# Patient Record
Sex: Male | Born: 1965 | Race: White | Hispanic: No | Marital: Married | Smoking: Current every day smoker
Health system: Southern US, Community
[De-identification: ages and names within clinical notes are randomized; demographics above are authoritative.]

## PROBLEM LIST (undated history)

## (undated) DIAGNOSIS — M1990 Unspecified osteoarthritis, unspecified site: Secondary | ICD-10-CM

## (undated) HISTORY — PX: DENTAL SURGERY: SHX609

---

## 2000-02-23 ENCOUNTER — Encounter: Admission: RE | Admit: 2000-02-23 | Discharge: 2000-03-22 | Payer: Self-pay | Admitting: Family Medicine

## 2000-08-31 ENCOUNTER — Encounter: Payer: Self-pay | Admitting: Family Medicine

## 2000-08-31 ENCOUNTER — Ambulatory Visit (HOSPITAL_COMMUNITY): Admission: RE | Admit: 2000-08-31 | Discharge: 2000-08-31 | Payer: Self-pay | Admitting: Family Medicine

## 2002-09-11 ENCOUNTER — Encounter
Admission: RE | Admit: 2002-09-11 | Discharge: 2002-12-10 | Payer: Self-pay | Admitting: Physical Medicine & Rehabilitation

## 2002-12-17 ENCOUNTER — Encounter: Admission: RE | Admit: 2002-12-17 | Discharge: 2003-03-17 | Payer: Self-pay | Admitting: Anesthesiology

## 2003-03-18 ENCOUNTER — Encounter
Admission: RE | Admit: 2003-03-18 | Discharge: 2003-06-16 | Payer: Self-pay | Admitting: Physical Medicine & Rehabilitation

## 2003-06-14 ENCOUNTER — Encounter
Admission: RE | Admit: 2003-06-14 | Discharge: 2003-09-12 | Payer: Self-pay | Admitting: Physical Medicine & Rehabilitation

## 2003-12-12 ENCOUNTER — Encounter
Admission: RE | Admit: 2003-12-12 | Discharge: 2004-03-02 | Payer: Self-pay | Admitting: Physical Medicine & Rehabilitation

## 2004-03-02 ENCOUNTER — Encounter
Admission: RE | Admit: 2004-03-02 | Discharge: 2004-05-31 | Payer: Self-pay | Admitting: Physical Medicine & Rehabilitation

## 2004-03-19 ENCOUNTER — Ambulatory Visit: Payer: Self-pay | Admitting: Physical Medicine & Rehabilitation

## 2004-06-09 ENCOUNTER — Encounter
Admission: RE | Admit: 2004-06-09 | Discharge: 2004-09-07 | Payer: Self-pay | Admitting: Physical Medicine & Rehabilitation

## 2004-11-09 ENCOUNTER — Encounter
Admission: RE | Admit: 2004-11-09 | Discharge: 2005-02-18 | Payer: Self-pay | Admitting: Physical Medicine & Rehabilitation

## 2010-07-20 ENCOUNTER — Observation Stay (HOSPITAL_COMMUNITY)
Admission: EM | Admit: 2010-07-20 | Discharge: 2010-07-22 | DRG: 399 | Disposition: A | Discharge status: Home / Self Care | Payer: BC Managed Care – PPO | Attending: Internal Medicine | Admitting: Internal Medicine

## 2010-07-20 ENCOUNTER — Emergency Department (HOSPITAL_COMMUNITY): Payer: BC Managed Care – PPO

## 2010-07-20 DIAGNOSIS — R599 Enlarged lymph nodes, unspecified: Secondary | ICD-10-CM | POA: Insufficient documentation

## 2010-07-20 DIAGNOSIS — G8929 Other chronic pain: Secondary | ICD-10-CM | POA: Insufficient documentation

## 2010-07-20 DIAGNOSIS — F17200 Nicotine dependence, unspecified, uncomplicated: Secondary | ICD-10-CM | POA: Insufficient documentation

## 2010-07-20 DIAGNOSIS — M549 Dorsalgia, unspecified: Secondary | ICD-10-CM | POA: Insufficient documentation

## 2010-07-20 DIAGNOSIS — A281 Cat-scratch disease: Principal | ICD-10-CM | POA: Insufficient documentation

## 2010-07-20 LAB — DIFFERENTIAL
Basophils Absolute: 0.1 10*3/uL (ref 0.0–0.1)
Basophils Relative: 0 % (ref 0–1)
Eosinophils Absolute: 0.3 10*3/uL (ref 0.0–0.7)
Eosinophils Relative: 2 % (ref 0–5)
Lymphocytes Relative: 15 % (ref 12–46)
Lymphs Abs: 2.5 10*3/uL (ref 0.7–4.0)
Monocytes Absolute: 1 10*3/uL (ref 0.1–1.0)
Monocytes Relative: 6 % (ref 3–12)
Neutro Abs: 13.4 10*3/uL — ABNORMAL HIGH (ref 1.7–7.7)
Neutrophils Relative %: 78 % — ABNORMAL HIGH (ref 43–77)

## 2010-07-20 LAB — CBC
HCT: 42 % (ref 39.0–52.0)
Hemoglobin: 15 g/dL (ref 13.0–17.0)
MCH: 32.1 pg (ref 26.0–34.0)
MCHC: 35.7 g/dL (ref 30.0–36.0)
MCV: 89.7 fL (ref 78.0–100.0)
Platelets: 276 10*3/uL (ref 150–400)
RBC: 4.68 MIL/uL (ref 4.22–5.81)
RDW: 13 % (ref 11.5–15.5)
WBC: 17.3 10*3/uL — ABNORMAL HIGH (ref 4.0–10.5)

## 2010-07-20 LAB — POCT I-STAT, CHEM 8
BUN: 7 mg/dL (ref 6–23)
Calcium, Ion: 1.12 mmol/L (ref 1.12–1.32)
Chloride: 104 mEq/L (ref 96–112)
Creatinine, Ser: 0.9 mg/dL (ref 0.4–1.5)
Glucose, Bld: 145 mg/dL — ABNORMAL HIGH (ref 70–99)
HCT: 46 % (ref 39.0–52.0)
Hemoglobin: 15.6 g/dL (ref 13.0–17.0)
Potassium: 4.3 mEq/L (ref 3.5–5.1)
Sodium: 136 mEq/L (ref 135–145)
TCO2: 21 mmol/L (ref 0–100)

## 2010-07-21 ENCOUNTER — Encounter (HOSPITAL_COMMUNITY): Payer: Self-pay

## 2010-07-21 DIAGNOSIS — A281 Cat-scratch disease: Secondary | ICD-10-CM

## 2010-07-21 MED ORDER — IOHEXOL 300 MG/ML  SOLN: 100.0000 mL | Freq: Once | INTRAMUSCULAR | Status: AC | PRN

## 2010-07-22 LAB — DIFFERENTIAL
Basophils Relative: 0 % (ref 0–1)
Lymphs Abs: 2.7 10*3/uL (ref 0.7–4.0)
Monocytes Absolute: 1.1 10*3/uL — ABNORMAL HIGH (ref 0.1–1.0)
Monocytes Relative: 9 % (ref 3–12)
Neutro Abs: 7.9 10*3/uL — ABNORMAL HIGH (ref 1.7–7.7)

## 2010-07-22 LAB — COMPREHENSIVE METABOLIC PANEL
BUN: 10 mg/dL (ref 6–23)
CO2: 25 mEq/L (ref 19–32)
Calcium: 9.5 mg/dL (ref 8.4–10.5)
Chloride: 107 mEq/L (ref 96–112)
Creatinine, Ser: 0.9 mg/dL (ref 0.4–1.5)
GFR calc Af Amer: >60 mL/min (ref >60)
GFR calc non Af Amer: >60 mL/min (ref >60)
Glucose, Bld: 105 mg/dL — ABNORMAL HIGH (ref 70–99)
Total Bilirubin: 0.3 mg/dL (ref 0.3–1.2)

## 2010-07-22 LAB — CBC
Hemoglobin: 14.5 g/dL (ref 13.0–17.0)
MCH: 31.3 pg (ref 26.0–34.0)
MCHC: 34.4 g/dL (ref 30.0–36.0)
MCV: 90.7 fL (ref 78.0–100.0)
RBC: 4.64 MIL/uL (ref 4.22–5.81)

## 2010-07-29 NOTE — Discharge Summary (Signed)
  NAME:  Foley, [name]                 ACCOUNT NO.:  [id number]  MEDICAL RECORD NO.:  [id number]           PATIENT TYPE:  I  LOCATION:  1320                         FACILITY:  [facility]  PHYSICIAN:  Marinda Elk, M.D.DATE OF BIRTH:  25-Jan-1966  DATE OF ADMISSION:  07/20/2010 DATE OF DISCHARGE:  07/22/2010                              DISCHARGE SUMMARY   PRIMARY CARE DOCTOR:  None.  DISCHARGE DIAGNOSES: 1. Cat scratch disease. 2. Tobacco abuse.  DISCHARGE MEDICATIONS: 1. Azithromycin 250 mg p.o. daily. 2. Hydrocodone 10/375 mg p.o. q.6 h. p.r.n. 3. Multivitamin one tab daily.  PROCEDURES PERFORMED:  CT chest shows axillary lymphadenopathy consistent with infectious and inflammatory changes.  BRIEF ADMITTING HISTORY AND PHYSICAL:  This is a 45 year old man with past medical history of chronic pain admitted for neuropathy and swelling on this of arms.  I started 1 week prior to admission.  He apparently went to __________ associated with some fevers, came to the ED for further evaluation.  CT scan showed results above.  We are asked to admit him and further evaluate.  Please refer to July 21, 2010, of further details.  HOSPITAL COURSE: 1. Cat scratch disease.  I recommended to azithromycin.  Check an HIV.     HIV was done which was negative.  He will continue azithromycin 4     more days.  He will follow up with a PCP and he does not have one. 2. Tobacco abuse counseling.  On day of discharge temperature 98, pulse 87, respirations 17, blood pressure 132/84.  He was satting 94% on room air.  LABS ON DAY OF DISCHARGE:  Sodium 139, potassium 4.4, chloride 107, bicarb of 25, glucose 105, BUN of 10, creatinine 0.9.  LFTs within normal limits.  His white count shows 12.2 with an ANC of 7.9.  His HIV is negative.     Marinda Elk, M.D.     AF/MEDQ  D:  07/22/2010  T:  07/23/2010  Job:  [id number]  Electronically Signed by Lambert Keto M.D. on 07/29/2010  10:18:11 AM

## 2010-08-25 NOTE — H&P (Signed)
NAME:  [name] Foley, [name] Foley                 ACCOUNT NO.:  [id number]  MEDICAL RECORD NO.:  [id number]           PATIENT TYPE:  E  LOCATION:  WLED                         FACILITY:  [facility]  PHYSICIAN:  Massie Maroon, MD        DATE OF BIRTH:  01-05-1966  DATE OF ADMISSION:  07/20/2010 DATE OF DISCHARGE:                             HISTORY & PHYSICAL   CHIEF COMPLAINT:  Adenopathy.  HISTORY OF PRESENT ILLNESS:  This 45 year old male with history of chronic back pain admits to developing some adenopathy or swelling under his right arm on Wednesday night.  He apparently went to urgent care earlier today due to some fever and was sent to the ED for evaluation. The patient notes that he recently had gotten a cat several months ago and the cat does scratch him.  CT scan of the chest shows axillary adenopathy consistent cat scratch disease, the largest lymph node measuring 4.8 x 1.9 x 2.6 cm.  The patient will be admitted for workup of axillary adenopathy, in light of the fact that he does not have any primary care physician to follow up with.  PAST MEDICAL HISTORY:  Back pain.  PAST SURGICAL HISTORY: 1. ESI (multiple) by Dr. Ethelene Hal. 2. Deviated septum repair.  SOCIAL HISTORY:  The patient smokes 1 pack per day for 35 years.  The patient drinks about 1 to 3 beers per week.  FAMILY HISTORY:  Father had neck, back, and hip surgery.  ALLERGIES:  No known drug allergies.  MEDICATIONS:  Norco 10/325 one p.o. q.6h. p.r.n. pain.  REVIEW OF SYSTEMS:  Negative for all 10 organ systems except for pertinent positive stated above.  PHYSICAL EXAMINATION:  VITAL SIGNS:  Temperature 99.2, pulse 107, blood pressure 150/87, pulse ox 95% on room air. HEENT:  Anicteric, EOMI, no nystagmus.  Pupils are 1.5 mm, symmetric. Direct and consensual reflexes intact.  Mucous membranes are moist. NECK:  No JVD, no bruit, no thyromegaly, no adenopathy. HEART:  Regular rate and rhythm.  S1, S2.  No murmurs, gallops, or  rubs. LUNGS:  Clear to auscultation bilaterally. ABDOMEN:  Soft, nontender, nondistended.  Positive bowel sounds. EXTREMITIES:  No cyanosis, clubbing, or edema. SKIN:  No rashes. LYMPH NODES:  A 4.4 cm right axillary adenopathy, no erythema, no warmth.  LABORATORY DATA:  WBC 17.3, hemoglobin 15.0, platelet count 276.  BUN 7, creatinine 0.9.  CT chest shows axillary adenopathy consistent with infectious or inflammatory change given history, ? Catscratch fever.  ASSESSMENT AND PLAN: 1. Right axillary adenopathy:  Continue Vicodin 10/325 one p.o. q.6 h     p.r.n. for pain.  Check VLDL, check LDH.  Treat with Unasyn 3 g IV     q.6 h.  If lymphadenopathy is not improving, then may need biopsy. 2. Chronic back pain:  Continue Norco. 3. Tobacco dependence:  The patient is counseled on smoking cessation     x10 minutes. 4. Deep vein thrombosis prophylaxis:  SCDs.     Massie Maroon, MD     JYK/MEDQ  D:  07/21/2010  T:  07/21/2010  Job:  [id number]  Electronically Signed by Pearson Grippe MD on 08/25/2010 09:49:05 PM

## 2012-03-24 ENCOUNTER — Other Ambulatory Visit: Payer: Self-pay | Admitting: Pain Medicine

## 2012-03-24 DIAGNOSIS — M545 Low back pain: Secondary | ICD-10-CM

## 2012-03-24 DIAGNOSIS — IMO0002 Reserved for concepts with insufficient information to code with codable children: Secondary | ICD-10-CM

## 2012-03-30 ENCOUNTER — Other Ambulatory Visit: Payer: BC Managed Care – PPO

## 2012-04-15 ENCOUNTER — Ambulatory Visit
Admission: RE | Admit: 2012-04-15 | Discharge: 2012-04-15 | Disposition: A | Discharge status: Home / Self Care | Payer: BC Managed Care – PPO | Source: Ambulatory Visit | Attending: Pain Medicine | Admitting: Pain Medicine

## 2012-04-15 DIAGNOSIS — M545 Low back pain: Secondary | ICD-10-CM

## 2012-04-15 DIAGNOSIS — IMO0002 Reserved for concepts with insufficient information to code with codable children: Secondary | ICD-10-CM

## 2015-06-08 ENCOUNTER — Encounter (HOSPITAL_COMMUNITY): Payer: Self-pay | Admitting: Emergency Medicine

## 2015-06-08 ENCOUNTER — Emergency Department (HOSPITAL_COMMUNITY): Payer: BLUE CROSS/BLUE SHIELD

## 2015-06-08 ENCOUNTER — Inpatient Hospital Stay (HOSPITAL_COMMUNITY)
Admission: EM | Admit: 2015-06-08 | Discharge: 2015-06-10 | DRG: 603 | Disposition: A | Discharge status: Home / Self Care | Payer: BLUE CROSS/BLUE SHIELD | Attending: Internal Medicine | Admitting: Internal Medicine

## 2015-06-08 DIAGNOSIS — G8929 Other chronic pain: Secondary | ICD-10-CM | POA: Diagnosis present

## 2015-06-08 DIAGNOSIS — F17210 Nicotine dependence, cigarettes, uncomplicated: Secondary | ICD-10-CM | POA: Diagnosis present

## 2015-06-08 DIAGNOSIS — E669 Obesity, unspecified: Secondary | ICD-10-CM | POA: Diagnosis present

## 2015-06-08 DIAGNOSIS — M1990 Unspecified osteoarthritis, unspecified site: Secondary | ICD-10-CM | POA: Diagnosis present

## 2015-06-08 DIAGNOSIS — Z716 Tobacco abuse counseling: Secondary | ICD-10-CM | POA: Diagnosis not present

## 2015-06-08 DIAGNOSIS — Z809 Family history of malignant neoplasm, unspecified: Secondary | ICD-10-CM

## 2015-06-08 DIAGNOSIS — K029 Dental caries, unspecified: Secondary | ICD-10-CM | POA: Insufficient documentation

## 2015-06-08 DIAGNOSIS — Z79899 Other long term (current) drug therapy: Secondary | ICD-10-CM

## 2015-06-08 DIAGNOSIS — G629 Polyneuropathy, unspecified: Secondary | ICD-10-CM

## 2015-06-08 DIAGNOSIS — M545 Low back pain, unspecified: Secondary | ICD-10-CM | POA: Diagnosis present

## 2015-06-08 DIAGNOSIS — Z6839 Body mass index (BMI) 39.0-39.9, adult: Secondary | ICD-10-CM

## 2015-06-08 DIAGNOSIS — K047 Periapical abscess without sinus: Secondary | ICD-10-CM | POA: Diagnosis present

## 2015-06-08 DIAGNOSIS — L03211 Cellulitis of face: Secondary | ICD-10-CM | POA: Diagnosis not present

## 2015-06-08 DIAGNOSIS — R030 Elevated blood-pressure reading, without diagnosis of hypertension: Secondary | ICD-10-CM | POA: Diagnosis present

## 2015-06-08 HISTORY — DX: Unspecified osteoarthritis, unspecified site: M19.90

## 2015-06-08 LAB — CBC WITH DIFFERENTIAL/PLATELET
BASOS ABS: 0 10*3/uL (ref 0.0–0.1)
BASOS PCT: 0 %
Eosinophils Absolute: 0.2 10*3/uL (ref 0.0–0.7)
Eosinophils Relative: 1 %
HEMATOCRIT: 43.9 % (ref 39.0–52.0)
HEMOGLOBIN: 15.2 g/dL (ref 13.0–17.0)
LYMPHS PCT: 13 %
Lymphs Abs: 1.9 10*3/uL (ref 0.7–4.0)
MCH: 31.7 pg (ref 26.0–34.0)
MCHC: 34.6 g/dL (ref 30.0–36.0)
MCV: 91.6 fL (ref 78.0–100.0)
MONO ABS: 1.1 10*3/uL — AB (ref 0.1–1.0)
MONOS PCT: 7 %
NEUTROS ABS: 12 10*3/uL — AB (ref 1.7–7.7)
NEUTROS PCT: 79 %
Platelets: 213 10*3/uL (ref 150–400)
RBC: 4.79 MIL/uL (ref 4.22–5.81)
RDW: 13.1 % (ref 11.5–15.5)
WBC: 15.2 10*3/uL — ABNORMAL HIGH (ref 4.0–10.5)

## 2015-06-08 LAB — BASIC METABOLIC PANEL
ANION GAP: 8 (ref 5–15)
BUN: 17 mg/dL (ref 6–20)
CHLORIDE: 106 mmol/L (ref 101–111)
CO2: 23 mmol/L (ref 22–32)
Calcium: 9.3 mg/dL (ref 8.9–10.3)
Creatinine, Ser: 0.87 mg/dL (ref 0.61–1.24)
GFR calc non Af Amer: >60 mL/min (ref >60)
GLUCOSE: 115 mg/dL — AB (ref 65–99)
Potassium: 4 mmol/L (ref 3.5–5.1)
Sodium: 137 mmol/L (ref 135–145)

## 2015-06-08 MED ORDER — ADULT MULTIVITAMIN W/MINERALS CH
1.0000 | ORAL_TABLET | Freq: Every day | ORAL | Status: DC
  Administered 2015-06-08 – 2015-06-10 (×3): 1 via ORAL
  Filled 2015-06-08 (×3): qty 1

## 2015-06-08 MED ORDER — ONDANSETRON HCL 4 MG PO TABS: 4.0000 mg | ORAL_TABLET | Freq: Four times a day (QID) | ORAL | Status: DC | PRN

## 2015-06-08 MED ORDER — MORPHINE SULFATE (PF) 4 MG/ML IV SOLN
4.0000 mg | Freq: Once | INTRAVENOUS | Status: AC
  Administered 2015-06-08: 4 mg via INTRAVENOUS
  Filled 2015-06-08: qty 1

## 2015-06-08 MED ORDER — ENOXAPARIN SODIUM 40 MG/0.4ML ~~LOC~~ SOLN
40.0000 mg | SUBCUTANEOUS | Status: DC
  Administered 2015-06-08: 40 mg via SUBCUTANEOUS
  Filled 2015-06-08: qty 0.4

## 2015-06-08 MED ORDER — MORPHINE SULFATE (PF) 4 MG/ML IV SOLN
4.0000 mg | Freq: Once | INTRAVENOUS | Status: AC
Start: 2015-06-08 — End: 2015-06-08
  Administered 2015-06-08: 4 mg via INTRAVENOUS
  Filled 2015-06-08: qty 1

## 2015-06-08 MED ORDER — VITAMIN B-12 100 MCG PO TABS
100.0000 ug | ORAL_TABLET | Freq: Every day | ORAL | Status: DC
  Administered 2015-06-08 – 2015-06-10 (×3): 100 ug via ORAL
  Filled 2015-06-08 (×3): qty 1

## 2015-06-08 MED ORDER — PHENTERMINE HCL 37.5 MG PO TABS: 37.5000 mg | ORAL_TABLET | Freq: Every day | ORAL | Status: DC

## 2015-06-08 MED ORDER — ONDANSETRON HCL 4 MG/2ML IJ SOLN: 4.0000 mg | Freq: Four times a day (QID) | INTRAMUSCULAR | Status: DC | PRN

## 2015-06-08 MED ORDER — CLINDAMYCIN PHOSPHATE 600 MG/50ML IV SOLN
600.0000 mg | Freq: Three times a day (TID) | INTRAVENOUS | Status: DC
  Administered 2015-06-08 – 2015-06-10 (×5): 600 mg via INTRAVENOUS
  Filled 2015-06-08 (×6): qty 50

## 2015-06-08 MED ORDER — HYDROCODONE-ACETAMINOPHEN 10-325 MG PO TABS
1.0000 | ORAL_TABLET | ORAL | Status: DC | PRN
  Administered 2015-06-08 – 2015-06-10 (×6): 1 via ORAL
  Filled 2015-06-08 (×6): qty 1

## 2015-06-08 MED ORDER — SODIUM CHLORIDE 0.9 % IV SOLN
INTRAVENOUS | Status: DC
  Administered 2015-06-08 – 2015-06-10 (×4): via INTRAVENOUS

## 2015-06-08 MED ORDER — ACETAMINOPHEN 650 MG RE SUPP: 650.0000 mg | Freq: Four times a day (QID) | RECTAL | Status: DC | PRN

## 2015-06-08 MED ORDER — KETOROLAC TROMETHAMINE 30 MG/ML IJ SOLN: 30.0000 mg | Freq: Four times a day (QID) | INTRAMUSCULAR | Status: DC | PRN

## 2015-06-08 MED ORDER — GABAPENTIN 300 MG PO CAPS
900.0000 mg | ORAL_CAPSULE | Freq: Every day | ORAL | Status: DC
  Administered 2015-06-08 – 2015-06-09 (×2): 900 mg via ORAL
  Filled 2015-06-08 (×3): qty 3

## 2015-06-08 MED ORDER — VITAMIN C 500 MG PO TABS
500.0000 mg | ORAL_TABLET | Freq: Every day | ORAL | Status: DC
  Administered 2015-06-08 – 2015-06-10 (×3): 500 mg via ORAL
  Filled 2015-06-08 (×3): qty 1

## 2015-06-08 MED ORDER — VITAMIN B-12 100 MCG PO TABS
ORAL_TABLET | Freq: Every day | ORAL | Status: DC
  Filled 2015-06-08: qty 1

## 2015-06-08 MED ORDER — HYDROCODONE BITARTRATE ER 60 MG PO T24A: 1.0000 | EXTENDED_RELEASE_TABLET | Freq: Every day | ORAL | Status: DC

## 2015-06-08 MED ORDER — GABAPENTIN 300 MG PO CAPS
300.0000 mg | ORAL_CAPSULE | Freq: Every day | ORAL | Status: DC
  Administered 2015-06-09 – 2015-06-10 (×2): 300 mg via ORAL
  Filled 2015-06-08 (×3): qty 1

## 2015-06-08 MED ORDER — MORPHINE SULFATE (PF) 2 MG/ML IV SOLN
2.0000 mg | INTRAVENOUS | Status: DC | PRN
  Administered 2015-06-08 – 2015-06-10 (×6): 2 mg via INTRAVENOUS
  Filled 2015-06-08 (×6): qty 1

## 2015-06-08 MED ORDER — ACETAMINOPHEN 325 MG PO TABS: 650.0000 mg | ORAL_TABLET | Freq: Four times a day (QID) | ORAL | Status: DC | PRN

## 2015-06-08 MED ORDER — DICLOFENAC SODIUM 75 MG PO TBEC
75.0000 mg | DELAYED_RELEASE_TABLET | Freq: Two times a day (BID) | ORAL | Status: DC
  Administered 2015-06-08 – 2015-06-10 (×4): 75 mg via ORAL
  Filled 2015-06-08 (×5): qty 1

## 2015-06-08 MED ORDER — GABAPENTIN 300 MG PO CAPS
600.0000 mg | ORAL_CAPSULE | Freq: Every day | ORAL | Status: DC
  Administered 2015-06-08 – 2015-06-09 (×2): 600 mg via ORAL
  Filled 2015-06-08 (×3): qty 2

## 2015-06-08 MED ORDER — OMEGA-3-ACID ETHYL ESTERS 1 G PO CAPS
1.0000 g | ORAL_CAPSULE | Freq: Every day | ORAL | Status: DC
  Administered 2015-06-08 – 2015-06-10 (×3): 1 g via ORAL
  Filled 2015-06-08 (×3): qty 1

## 2015-06-08 MED ORDER — IOHEXOL 300 MG/ML  SOLN
75.0000 mL | Freq: Once | INTRAMUSCULAR | Status: AC | PRN
  Administered 2015-06-08: 75 mL via INTRAVENOUS

## 2015-06-08 MED ORDER — CLINDAMYCIN PHOSPHATE 600 MG/50ML IV SOLN
600.0000 mg | Freq: Once | INTRAVENOUS | Status: AC
  Administered 2015-06-08: 600 mg via INTRAVENOUS
  Filled 2015-06-08: qty 50

## 2015-06-08 NOTE — H&P (Addendum)
[facility] History and Physical  [name] Foley ONG:[id number] DOB: 1965-07-02 DOA: 06/08/2015  Referring physician: Iantha Fallen PCP: Charyl Dancer sun at [facility] ( mainly for pain management)  Chief Complaint: right facial swelling and jaq pain x 2 days  HPI:  50 year old obese male with chronic low back pain, peripheral neuropathy (mainly involving his fingers) and arthritis who had a toothache about 2 weeks back with swollen right lower jaw. He saw a dentist who put him on Keflex for a 10 day course which he has completed. He reports that the right jaw swelling and pain did not completely go away. 2 days back he noticed that his right jaw pain was worse. Since yesterday morning he noticed increased swelling of the right jaw with erythema on the cheek with some warmth and increased throbbing pain. Denies any fever, difficulty swallowing, hoarseness of voice, chills, nausea, vomiting, headache, dizziness, chest pain, palpitations or shortness of breath, bowel or urinary symptoms.  Course in the ED patient had low-grade fever and elevated blood pressure. Blood work showed WBC of 15.2 K, remaining labs were normal. Maxillofacial CT with contrast done showed fluid filled spaces around the roots of posterior molars in the maxilla bilaterally concerning for possible dental abscesses. Also showed extensive inflammatory changes involving the soft tissues lateral to the right side of the mandible without fluid collection or abscess consistent with cellulitis. Patient given a dose of IV clindamycin and IV morphine and hospitalist admission requested on observation.  Review of Systems:  Constitutional: Denies fever, chills, diaphoresis, appetite change and fatigue.  HEENT: Denies visual or hearing symptoms , swelling over right side of the face with redness and pain, denies difficulty swallowing, change in voice neck pain or stiffness Respiratory: Denies SOB, DOE, cough, chest tightness,  and  wheezing.   Cardiovascular: Denies chest pain, palpitations and leg swelling.  Gastrointestinal: Denies nausea, vomiting, abdominal pain, diarrhea, constipation, blood in stool and abdominal distention.  Genitourinary: Denies dysuria, urgency, frequency, hematuria, flank pain and difficulty urinating.  Endocrine: Denies: hot or cold intolerance, Musculoskeletal: Chronic back pain, joint pains, Denies myalgias, , joint swelling, and gait problem.  Skin: Denies pallor, rash and wound.  Neurological: Denies dizziness, seizures, syncope, weakness, light-headedness, numbness and headaches.  Hematological: Denies adenopathy.  Psychiatric/Behavioral: Denies  confusion,   Past Medical History  Diagnosis Date  . Arthritis    Past Surgical History  Procedure Laterality Date  . Dental surgery     Social History:  reports that he has been smoking.  He does not have any smokeless tobacco history on file. He reports that he drinks alcohol. His drug history is not on file.  No Known Allergies  Family History  Problem Relation Age of Onset  . Cancer Mother     Prior to Admission medications   Medication Sig Start Date End Date Taking? Authorizing Provider  Ascorbic Acid (VITAMIN C PO) Take 1 tablet by mouth daily.   Yes Historical Provider, MD  Cyanocobalamin (VITAMIN B 12 PO) Take 1 capsule by mouth daily.   Yes Historical Provider, MD  diclofenac (VOLTAREN) 75 MG EC tablet Take 75 mg by mouth 2 (two) times daily. 06/05/15  Yes Historical Provider, MD  gabapentin (NEURONTIN) 300 MG capsule TAKE 1 CAPSULE BY MOUTH AT LUNCH, 2 CAPSULES AT SUPPER, AND 3 CAPSULES AT BEDTIME 06/05/15  Yes Historical Provider, MD  HYDROcodone-acetaminophen (NORCO) 10-325 MG tablet Take 1 tablet by mouth 4 (four) times daily as needed for moderate pain.  06/04/15  Yes Historical Provider, MD  [facility] 60 MG T24A Take 1 tablet by mouth daily. 05/24/15  Yes Historical Provider, MD  Multiple Vitamin (MULTIVITAMIN WITH  MINERALS) TABS tablet Take 1 tablet by mouth daily.   Yes Historical Provider, MD  Omega-3 Fatty Acids (FISH OIL PO) Take 1 capsule by mouth daily.   Yes Historical Provider, MD  phentermine (ADIPEX-P) 37.5 MG tablet Take 37.5 mg by mouth daily. 05/16/15  Yes Historical Provider, MD     Physical Exam:  Filed Vitals:   06/08/15 1022 06/08/15 1240  BP: 181/106 137/71  Pulse: 93 74  Temp: 99.2 F (37.3 C)   TempSrc: Oral   Resp: 18 17  SpO2: 96% 95%    Constitutional: Vital signs reviewed.  Middle aged obese male not in distress HEENT: Erythema with significant swelling of the right lower jaw, multiple caried teeth,  Tender to pressure over first right lower premolar, no abscess noted , tender to pressure over right jaw.  no pallor, no icterus, moist oral mucosa, no cervical lymphadenopathy Cardiovascular: Normal S1 and S2, no murmurs Chest: CTAB, no wheezes, rales, or rhonchi Abdominal: Soft. Non-tender, non-distended, bowel sounds are normal,  Ext: warm, no edema Neurological: Alert and oriented   Labs on Admission:  Basic Metabolic Panel:  Recent Labs Lab 06/08/15 1110  NA 137  K 4.0  CL 106  CO2 23  GLUCOSE 115*  BUN 17  CREATININE 0.87  CALCIUM 9.3   Liver Function Tests: No results for input(s): AST, ALT, ALKPHOS, BILITOT, PROT, ALBUMIN in the last 168 hours. No results for input(s): LIPASE, AMYLASE in the last 168 hours. No results for input(s): AMMONIA in the last 168 hours. CBC:  Recent Labs Lab 06/08/15 1110  WBC 15.2*  NEUTROABS 12.0*  HGB 15.2  HCT 43.9  MCV 91.6  PLT 213   Cardiac Enzymes: No results for input(s): CKTOTAL, CKMB, CKMBINDEX, TROPONINI in the last 168 hours. BNP: Invalid input(s): POCBNP CBG: No results for input(s): GLUCAP in the last 168 hours.  Radiological Exams on Admission: Ct Maxillofacial W/cm  06/08/2015  CLINICAL DATA:  Severe right facial swelling and pain. EXAM: CT MAXILLOFACIAL WITH CONTRAST TECHNIQUE: Multidetector  CT imaging of the maxillofacial structures was performed with intravenous contrast. Multiplanar CT image reconstructions were also generated. A small metallic BB was placed on the right temple in order to reliably differentiate right from left. CONTRAST:  75mL OMNIPAQUE IOHEXOL 300 MG/ML  SOLN COMPARISON:  None. FINDINGS: No fracture is noted. Paranasal sinuses appear normal. Pterygoid plates appear normal. Poor dentition is noted. Bony lucency is seen around the roots of posterior molars bilaterally which may represent abscess. Globes and orbits are unremarkable. Abnormal soft tissue stranding is noted adjacent to the right mandible without definite abscess formation, most consistent with cellulitis. Parotid and submandibular glands are unremarkable. Mildly enlarged lymph nodes are noted in both level 2 regions, with the largest measuring 8 x 12 mm on the right ; these most likely are inflammatory in etiology. IMPRESSION: Poor dentition is noted, with fluid-filled spaces around the roots of posterior molars in the maxilla bilaterally concerning for possible dental abscesses. Extensive inflammatory changes are seen involving the soft tissues lateral to the right side of the mandible without fluid collection or abscess. This is most consistent with cellulitis. Electronically Signed   By: Lupita Raider, M.D.   On: 06/08/2015 12:26    EKG: none  Assessment/Plan  Active Problems:   Cellulitis of face with right lower  dental abscess Admit under observation. Continue empiric IV clindamycin. Pain control with home dose hydrocodone and when necessary IV morphine. Will place him on full liquid for now. Supportive care with Tylenol and antiemetics as needed. -Will consult dentist in a.m. If available. Otherwise will monitor for improvement on antibiotics and discharge him to follow up with dentist to have infected teeth extraction on 1/3.   Chronic low back pain and peripheral neuropathy Resume home  medications  Obesity  Tobacco abuse Smokes one pack per day Counseled on cessation    Diet:full liquid  DVT prophylaxis: sq lovenox   Code Status: full code Family Communication: wife at bedside Disposition Plan: Admit under observation. Home once clinically improved.  Eddie NorthDHUNGEL, Richele Strand [facility] Pager [phone]  Total time spent on admission :50 minutes  If 7PM-7AM, please contact night-coverage [email or web address] Password [email or web address] 06/08/2015, 1:48 PM

## 2015-06-08 NOTE — ED Notes (Signed)
Pt has profound swelling on r/side of face. Pt stated that the pain and swelling have increased over the last 2 days. He was seen by dentist 2 weeks ago for c/o dental pain, treated for infection. Reported some relief of discomfort until 2 days ago. Pt is alert and appropriate. Wife at bedside

## 2015-06-08 NOTE — ED Provider Notes (Signed)
HPI: Patient reports 2 week history of constant, moderate, worsening right lower dental pain. He reports he was seen by his dentist, and given an abx which he took to completion.  However, 2 days ago his pain and swelling became worse.  He is takes norco, and that has not alleviated his pain.  He denies difficulty swallowing, trismus, N/V, fever, chills, SOB, CP, or abdominal pain.  PE: General: Alert and no acute distress HEENT: atraumatic.  Significant right sided facial and neck swelling.  No trismus.  Right anterior neck TTP.  No erythema or crepitus. Respiratory: normal effort, no retractions, no use of accessory muscles Cardiac: regular rate and rhythm Abdominal: nondistended, soft Neuro: no gross deficit Psych: normal speech, appropriate mood and affect   MDM: Patient presents with worsening dental pain after failing PO abx.  I believe patient needs IV abx and probable CT maxillofacial to assess extent of infection.  Patient will be moved to the acute side.         Cheri FowlerKayla Olamide Lahaie, PA-C 06/08/15 1115

## 2015-06-08 NOTE — ED Provider Notes (Signed)
CSN: [id number]     Arrival date & time 06/08/15  1005 History   First MD Initiated Contact with Patient 06/08/15 1009     Chief Complaint  Patient presents with  . Facial Swelling    2 day hx of facial swelling  . Dental Pain    2 week hx of dental pain   HPI Patient started having trouble with toothache and mild facial swelling about 2 weeks ago. He was evaluated by a dentist and was started on antibiotics. He was told the symptoms should be getting better within a day or 2. Patient has noticed that his symptoms really have not changed that much. He was unable to follow up with his dentist because the office has been closed since Christmas. Patient states yesterday and today however the symptoms became acutely worse. His face is significantly more swollen. He is having increasing pain. He denies any trouble with fever. He denies any difficulty swallowing. Past Medical History  Diagnosis Date  . Arthritis    Past Surgical History  Procedure Laterality Date  . Dental surgery     Family History  Problem Relation Age of Onset  . Cancer Mother    Social History  Substance Use Topics  . Smoking status: Current Every Day Smoker  . Smokeless tobacco: None  . Alcohol Use: Yes    Review of Systems  All other systems reviewed and are negative.     Allergies  Review of patient's allergies indicates no known allergies.  Home Medications   Prior to Admission medications   Medication Sig Start Date End Date Taking? Authorizing Provider  Ascorbic Acid (VITAMIN C PO) Take 1 tablet by mouth daily.   Yes Historical Provider, MD  Cyanocobalamin (VITAMIN B 12 PO) Take 1 capsule by mouth daily.   Yes Historical Provider, MD  diclofenac (VOLTAREN) 75 MG EC tablet Take 75 mg by mouth 2 (two) times daily. 06/05/15  Yes Historical Provider, MD  gabapentin (NEURONTIN) 300 MG capsule TAKE 1 CAPSULE BY MOUTH AT LUNCH, 2 CAPSULES AT SUPPER, AND 3 CAPSULES AT BEDTIME 06/05/15  Yes Historical  Provider, MD  HYDROcodone-acetaminophen (NORCO) 10-325 MG tablet Take 1 tablet by mouth 4 (four) times daily as needed for moderate pain.  06/04/15  Yes Historical Provider, MD  HYSINGLA ER 60 MG T24A Take 1 tablet by mouth daily. 05/24/15  Yes Historical Provider, MD  Multiple Vitamin (MULTIVITAMIN WITH MINERALS) TABS tablet Take 1 tablet by mouth daily.   Yes Historical Provider, MD  Omega-3 Fatty Acids (FISH OIL PO) Take 1 capsule by mouth daily.   Yes Historical Provider, MD  phentermine (ADIPEX-P) 37.5 MG tablet Take 37.5 mg by mouth daily. 05/16/15  Yes Historical Provider, MD   BP 181/106 mmHg  Pulse 93  Temp(Src) 99.2 F (37.3 C) (Oral)  Resp 18  SpO2 96% Physical Exam  Constitutional: No distress.  Overweight  HENT:  Head: Normocephalic and atraumatic.  Right Ear: External ear normal.  Left Ear: External ear normal.  Mouth/Throat: No trismus in the jaw. Dental abscesses and dental caries present. No uvula swelling or lacerations. No oropharyngeal exudate or posterior oropharyngeal edema.  Eyes: Conjunctivae are normal. Right eye exhibits no discharge. Left eye exhibits no discharge. No scleral icterus.  Neck: Normal range of motion. Neck supple. No rigidity. Erythema present. No tracheal deviation present.  Large amount of submandibular and lower facial swelling on the right side  Cardiovascular: Normal rate, regular rhythm and intact distal pulses.   Pulmonary/Chest:  Effort normal and breath sounds normal. No stridor. No respiratory distress. He has no wheezes. He has no rales.  Abdominal: Soft. Bowel sounds are normal. He exhibits no distension. There is no tenderness. There is no rebound and no guarding.  Musculoskeletal: He exhibits no edema or tenderness.  Neurological: He is alert. He has normal strength. No cranial nerve deficit (no facial droop, extraocular movements intact, no slurred speech) or sensory deficit. He exhibits normal muscle tone. He displays no seizure  activity. Coordination normal.  Skin: Skin is warm and dry. No rash noted. He is not diaphoretic.  Psychiatric: He has a normal mood and affect.  Nursing note and vitals reviewed.   ED Course  Procedures (including critical care time) Labs Review Labs Reviewed  CBC WITH DIFFERENTIAL/PLATELET - Abnormal; Notable for the following:    WBC 15.2 (*)    Neutro Abs 12.0 (*)    Monocytes Absolute 1.1 (*)    All other components within normal limits  BASIC METABOLIC PANEL - Abnormal; Notable for the following:    Glucose, Bld 115 (*)    All other components within normal limits    Imaging Review Ct Maxillofacial W/cm  06/08/2015  CLINICAL DATA:  Severe right facial swelling and pain. EXAM: CT MAXILLOFACIAL WITH CONTRAST TECHNIQUE: Multidetector CT imaging of the maxillofacial structures was performed with intravenous contrast. Multiplanar CT image reconstructions were also generated. A small metallic BB was placed on the right temple in order to reliably differentiate right from left. CONTRAST:  75mL OMNIPAQUE IOHEXOL 300 MG/ML  SOLN COMPARISON:  None. FINDINGS: No fracture is noted. Paranasal sinuses appear normal. Pterygoid plates appear normal. Poor dentition is noted. Bony lucency is seen around the roots of posterior molars bilaterally which may represent abscess. Globes and orbits are unremarkable. Abnormal soft tissue stranding is noted adjacent to the right mandible without definite abscess formation, most consistent with cellulitis. Parotid and submandibular glands are unremarkable. Mildly enlarged lymph nodes are noted in both level 2 regions, with the largest measuring 8 x 12 mm on the right ; these most likely are inflammatory in etiology. IMPRESSION: Poor dentition is noted, with fluid-filled spaces around the roots of posterior molars in the maxilla bilaterally concerning for possible dental abscesses. Extensive inflammatory changes are seen involving the soft tissues lateral to the right  side of the mandible without fluid collection or abscess. This is most consistent with cellulitis. Electronically Signed   By: Lupita Raider, M.D.   On: 06/08/2015 12:26   I have personally reviewed and evaluated these images and lab results as part of my medical decision-making.  Medications  0.9 %  sodium chloride infusion ( Intravenous New Bag/Given 06/08/15 1109)  clindamycin (CLEOCIN) IVPB 600 mg (0 mg Intravenous Stopped 06/08/15 1140)  morphine 4 MG/ML injection 4 mg (4 mg Intravenous Given 06/08/15 1110)  iohexol (OMNIPAQUE) 300 MG/ML solution 75 mL (75 mLs Intravenous Contrast Given 06/08/15 1207)  morphine 4 MG/ML injection 4 mg (4 mg Intravenous Given 06/08/15 1239)     MDM   Final diagnoses:  Facial cellulitis  Dental caries   The patient's CT scan shows dental caries and most likely abscess.  However, he is developed a facial cellulitis associated with significant inflammatory changes. There is no evidence of large abscess or fluid collection associated with this facial swelling. Patient has already been on oral antibiotics. I will consult with medical service regarding admission for IV antibiotic treatment. Patient will ultimately need further dental care.  Linwood Dibbles, MD 06/08/15 [phone]

## 2015-06-09 DIAGNOSIS — K029 Dental caries, unspecified: Secondary | ICD-10-CM | POA: Diagnosis present

## 2015-06-09 DIAGNOSIS — Z79899 Other long term (current) drug therapy: Secondary | ICD-10-CM | POA: Diagnosis not present

## 2015-06-09 DIAGNOSIS — Z716 Tobacco abuse counseling: Secondary | ICD-10-CM | POA: Diagnosis not present

## 2015-06-09 DIAGNOSIS — M545 Low back pain: Secondary | ICD-10-CM | POA: Diagnosis present

## 2015-06-09 DIAGNOSIS — G629 Polyneuropathy, unspecified: Secondary | ICD-10-CM | POA: Diagnosis present

## 2015-06-09 DIAGNOSIS — R030 Elevated blood-pressure reading, without diagnosis of hypertension: Secondary | ICD-10-CM | POA: Diagnosis present

## 2015-06-09 DIAGNOSIS — L03211 Cellulitis of face: Secondary | ICD-10-CM | POA: Diagnosis present

## 2015-06-09 DIAGNOSIS — Z6839 Body mass index (BMI) 39.0-39.9, adult: Secondary | ICD-10-CM | POA: Diagnosis not present

## 2015-06-09 DIAGNOSIS — K047 Periapical abscess without sinus: Secondary | ICD-10-CM | POA: Diagnosis present

## 2015-06-09 DIAGNOSIS — M1990 Unspecified osteoarthritis, unspecified site: Secondary | ICD-10-CM | POA: Diagnosis present

## 2015-06-09 DIAGNOSIS — G8929 Other chronic pain: Secondary | ICD-10-CM | POA: Diagnosis present

## 2015-06-09 DIAGNOSIS — E669 Obesity, unspecified: Secondary | ICD-10-CM | POA: Diagnosis present

## 2015-06-09 DIAGNOSIS — F17210 Nicotine dependence, cigarettes, uncomplicated: Secondary | ICD-10-CM | POA: Diagnosis present

## 2015-06-09 DIAGNOSIS — Z809 Family history of malignant neoplasm, unspecified: Secondary | ICD-10-CM | POA: Diagnosis not present

## 2015-06-09 LAB — CBC
HCT: 42.3 % (ref 39.0–52.0)
HEMOGLOBIN: 14.3 g/dL (ref 13.0–17.0)
MCH: 31 pg (ref 26.0–34.0)
MCHC: 33.8 g/dL (ref 30.0–36.0)
MCV: 91.8 fL (ref 78.0–100.0)
Platelets: 201 10*3/uL (ref 150–400)
RBC: 4.61 MIL/uL (ref 4.22–5.81)
RDW: 13.3 % (ref 11.5–15.5)
WBC: 14 10*3/uL — ABNORMAL HIGH (ref 4.0–10.5)

## 2015-06-09 NOTE — Progress Notes (Signed)
[facility] PROGRESS NOTE  [name] Foley ION:[id number] DOB: 03/24/1966 DOA: 06/08/2015 PCP: No primary care provider on file.  Assessment/Plan: Active Problems:  Cellulitis of face with right lower dental abscess - Continue empiric IV clindamycin. Pain control with home dose hydrocodone and when necessary IV morphine. Advanced diet. -If continues to improve will decide home in a.m. follow-up with his dentist tomorrow.     Chronic low back pain and peripheral neuropathy Resume home medications    Tobacco abuse Smokes one pack per day Counseled on cessation  Code Status: Full code Family Communication: None at bedside Disposition Plan: Home tomorrow with outpatient dental follow-up   Consultants:  None  Procedures:  Maxillofacial CT  Antibiotics:  IV Clindamycin  HPI/Subjective: Reports still having pain in his right jaw but better since yesterday  Objective: Filed Vitals:   06/08/15 2120 06/09/15 0515  BP: 149/77 140/77  Pulse: 74 83  Temp: 98.8 F (37.1 C) 98.9 F (37.2 C)  Resp: 16 16    Intake/Output Summary (Last 24 hours) at 06/09/15 1340 Last data filed at 06/09/15 0900  Gross per 24 hour  Intake   1860 ml  Output      2 ml  Net   1858 ml   Filed Weights   06/08/15 1633  Weight: 127.007 kg (280 lb)    Exam:   General: Middle-aged obese male not in distress  HEENT: Swollen right jaw with erythema (improved from admission), moist mucosa, able to open his mouth fully  Cardiovascular: Sinus and S2, no murmurs  Respiratory: Clear bilaterally  Abdomen: Soft, nondistended, nontender  Data Reviewed: Basic Metabolic Panel:  Recent Labs Lab 06/08/15 1110  NA 137  K 4.0  CL 106  CO2 23  GLUCOSE 115*  BUN 17  CREATININE 0.87  CALCIUM 9.3   Liver Function Tests: No results for input(s): AST, ALT, ALKPHOS, BILITOT, PROT, ALBUMIN in the last 168 hours. No results for input(s): LIPASE, AMYLASE in the last 168 hours. No results  for input(s): AMMONIA in the last 168 hours. CBC:  Recent Labs Lab 06/08/15 1110 06/09/15 0614  WBC 15.2* 14.0*  NEUTROABS 12.0*  --   HGB 15.2 14.3  HCT 43.9 42.3  MCV 91.6 91.8  PLT 213 201   Cardiac Enzymes: No results for input(s): CKTOTAL, CKMB, CKMBINDEX, TROPONINI in the last 168 hours. BNP (last 3 results) No results for input(s): BNP in the last 8760 hours.  ProBNP (last 3 results) No results for input(s): PROBNP in the last 8760 hours.  CBG: No results for input(s): GLUCAP in the last 168 hours.  No results found for this or any previous visit (from the past 240 hour(s)).   Studies: Ct Maxillofacial W/cm  06/08/2015  CLINICAL DATA:  Severe right facial swelling and pain. EXAM: CT MAXILLOFACIAL WITH CONTRAST TECHNIQUE: Multidetector CT imaging of the maxillofacial structures was performed with intravenous contrast. Multiplanar CT image reconstructions were also generated. A small metallic BB was placed on the right temple in order to reliably differentiate right from left. CONTRAST:  75mL OMNIPAQUE IOHEXOL 300 MG/ML  SOLN COMPARISON:  None. FINDINGS: No fracture is noted. Paranasal sinuses appear normal. Pterygoid plates appear normal. Poor dentition is noted. Bony lucency is seen around the roots of posterior molars bilaterally which may represent abscess. Globes and orbits are unremarkable. Abnormal soft tissue stranding is noted adjacent to the right mandible without definite abscess formation, most consistent with cellulitis. Parotid and submandibular glands are unremarkable. Mildly enlarged lymph nodes are  noted in both level 2 regions, with the largest measuring 8 x 12 mm on the right ; these most likely are inflammatory in etiology. IMPRESSION: Poor dentition is noted, with fluid-filled spaces around the roots of posterior molars in the maxilla bilaterally concerning for possible dental abscesses. Extensive inflammatory changes are seen involving the soft tissues lateral  to the right side of the mandible without fluid collection or abscess. This is most consistent with cellulitis. Electronically Signed   By: Lupita RaiderJames  Green Foley, M.D.   On: 06/08/2015 12:26    Scheduled Meds: . clindamycin (CLEOCIN) IV  600 mg Intravenous 3 times per day  . diclofenac  75 mg Oral BID  . gabapentin  300 mg Oral QPC breakfast  . gabapentin  600 mg Oral QPC supper  . gabapentin  900 mg Oral QHS  . multivitamin with minerals  1 tablet Oral Daily  . omega-3 acid ethyl esters  1 g Oral Daily  . vitamin B-12  100 mcg Oral Daily  . vitamin C  500 mg Oral Daily   Continuous Infusions: . sodium chloride 125 mL/hr at 06/08/15 2109      Time spent: 25 minutes    [name] Foley  [facility] Pager [phone]. If 7PM-7AM, please contact night-coverage at [email or web address], password [email or web address] 06/09/2015, 1:40 PM

## 2015-06-10 DIAGNOSIS — Z716 Tobacco abuse counseling: Secondary | ICD-10-CM | POA: Insufficient documentation

## 2015-06-10 MED ORDER — CLINDAMYCIN HCL 300 MG PO CAPS: 600.0000 mg | ORAL_CAPSULE | Freq: Three times a day (TID) | ORAL | Status: AC

## 2015-06-10 NOTE — Discharge Summary (Signed)
Physician Discharge Summary  [name] Foley JJO:[id number] DOB: [birth year] DOA: 06/08/2015  PCP: No primary care provider on file.  Admit date: 06/08/2015 Discharge date: 06/10/2015  Time spent: 25 minutes  Recommendations for Outpatient Follow-up:  1. Discharge home with follow-up with his dentist today. Patient will complete 2 weeks of oral clindamycin on/14/2017   Discharge Diagnoses:  Principal Problem:   Cellulitis of face   Active Problems:   Obesity   Peripheral neuropathy (HCC)   Low back pain   Dental abscess   Discharge Condition: Fair  Diet recommendation: Regular  Filed Weights   06/08/15 1633  Weight: 127.007 kg (280 lb)    History of present illness:  toothache about 2 weeks back with swollen right lower jaw. He saw a dentist who put him on Keflex for a 10 day course which he has completed. He reports that the right jaw swelling and pain did not completely go away. 2 days back he noticed that his right jaw pain was worse. Since yesterday morning he noticed increased swelling of the right jaw with erythema on the cheek with some warmth and increased throbbing pain. Denies any fever, difficulty swallowing, hoarseness of voice, chills, nausea, vomiting, headache, dizziness, chest pain, palpitations or shortness of breath, bowel or urinary symptoms.  Course in the ED  patient had low-grade fever and elevated blood pressure. Blood work showed WBC of 15.2 K, remaining labs were normal. Maxillofacial CT with contrast done showed fluid filled spaces around the roots of posterior molars in the maxilla bilaterally concerning for possible dental abscesses. Also showed extensive inflammatory changes involving the soft tissues lateral to the right side of the mandible without fluid collection or abscess consistent with cellulitis. Patient given a dose of IV clindamycin and IV morphine and hospitalist admission requested on observation.   Hospital Course:  Active Problems:   Cellulitis of face with right lower dental abscess - Symptoms improving with empiric IV clindamycin. Still has swelling over his lower jaw. Remains afebrile and leukocytosis improved. Pain control with home dose hydrocodone and when necessary IV morphine. Tolerated Advanced diet. He should follow-up with his dentist today or tomorrow. Stable for discharge home on oral clindamycin to complete 2 weeks course of antibiotics.    Chronic low back pain and peripheral neuropathy Resumed home medications    Tobacco abuse Smokes one pack per day Counseled on cessation  Code Status: Full code Family Communication: None at bedside Disposition Plan: Home    Consultants:  None  Procedures:  Maxillofacial CT  Antibiotics:  IV Clindamycin    Discharge Exam: Filed Vitals:   06/09/15 2145 06/10/15 0511  BP: 141/79 135/75  Pulse: 77 67  Temp: 99.1 F (37.3 C) 97.9 F (36.6 C)  Resp: 16 16    General: Morbidly obese male not in distress  HEENT: Swollen right jaw with erythema (improved from admission), moist mucosa, able to open his mouth fully, tender to pressure over lower right premolar with multiple caried teeth, no visible abscess  Cardiovascular: Normal S1 and S2, no murmurs  Respiratory: Clear bilaterally  Abdomen: Soft, nondistended, nontender  Musculoskeletal: Warm, no edema  Discharge Instructions    Current Discharge Medication List    START taking these medications   Details  clindamycin (CLEOCIN) 300 MG capsule Take 2 capsules (600 mg total) by mouth 3 (three) times daily. Qty: 72 capsule, Refills: 0      CONTINUE these medications which have NOT CHANGED   Details  Ascorbic Acid (VITAMIN C PO)  Take 1 tablet by mouth daily.    Cyanocobalamin (VITAMIN B 12 PO) Take 1 capsule by mouth daily.    diclofenac (VOLTAREN) 75 MG EC tablet Take 75 mg by mouth 2 (two) times daily. Refills: 5    gabapentin (NEURONTIN) 300 MG capsule TAKE 1 CAPSULE BY MOUTH  AT LUNCH, 2 CAPSULES AT SUPPER, AND 3 CAPSULES AT BEDTIME Refills: 5    HYDROcodone-acetaminophen (NORCO) 10-325 MG tablet Take 1 tablet by mouth 4 (four) times daily as needed for moderate pain.  Refills: 0    HYSINGLA ER 60 MG T24A Take 1 tablet by mouth daily. Refills: 0    Multiple Vitamin (MULTIVITAMIN WITH MINERALS) TABS tablet Take 1 tablet by mouth daily.    Omega-3 Fatty Acids (FISH OIL PO) Take 1 capsule by mouth daily.    phentermine (ADIPEX-P) 37.5 MG tablet Take 37.5 mg by mouth daily. Refills: 0       No Known Allergies Follow-up Information    Follow up with Lillia Abed, PA. Schedule an appointment as soon as possible for a visit in 1 day.   Specialty:  Physician Assistant   Contact information:   [address] [place] [address] [phone]        The results of significant diagnostics from this hospitalization (including imaging, microbiology, ancillary and laboratory) are listed below for reference.    Significant Diagnostic Studies: Ct Maxillofacial W/cm  06/08/2015  CLINICAL DATA:  Severe right facial swelling and pain. EXAM: CT MAXILLOFACIAL WITH CONTRAST TECHNIQUE: Multidetector CT imaging of the maxillofacial structures was performed with intravenous contrast. Multiplanar CT image reconstructions were also generated. A small metallic BB was placed on the right temple in order to reliably differentiate right from left. CONTRAST:  75mL OMNIPAQUE IOHEXOL 300 MG/ML  SOLN COMPARISON:  None. FINDINGS: No fracture is noted. Paranasal sinuses appear normal. Pterygoid plates appear normal. Poor dentition is noted. Bony lucency is seen around the roots of posterior molars bilaterally which may represent abscess. Globes and orbits are unremarkable. Abnormal soft tissue stranding is noted adjacent to the right mandible without definite abscess formation, most consistent with cellulitis. Parotid and submandibular glands are unremarkable. Mildly enlarged lymph  nodes are noted in both level 2 regions, with the largest measuring 8 x 12 mm on the right ; these most likely are inflammatory in etiology. IMPRESSION: Poor dentition is noted, with fluid-filled spaces around the roots of posterior molars in the maxilla bilaterally concerning for possible dental abscesses. Extensive inflammatory changes are seen involving the soft tissues lateral to the right side of the mandible without fluid collection or abscess. This is most consistent with cellulitis. Electronically Signed   By: Lupita Raider, M.D.   On: 06/08/2015 12:26    Microbiology: No results found for this or any previous visit (from the past 240 hour(s)).   Labs: Basic Metabolic Panel:  Recent Labs Lab 06/08/15 1110  NA 137  K 4.0  CL 106  CO2 23  GLUCOSE 115*  BUN 17  CREATININE 0.87  CALCIUM 9.3   Liver Function Tests: No results for input(s): AST, ALT, ALKPHOS, BILITOT, PROT, ALBUMIN in the last 168 hours. No results for input(s): LIPASE, AMYLASE in the last 168 hours. No results for input(s): AMMONIA in the last 168 hours. CBC:  Recent Labs Lab 06/08/15 1110 06/09/15 0614  WBC 15.2* 14.0*  NEUTROABS 12.0*  --   HGB 15.2 14.3  HCT 43.9 42.3  MCV 91.6 91.8  PLT 213 201  Cardiac Enzymes: No results for input(s): CKTOTAL, CKMB, CKMBINDEX, TROPONINI in the last 168 hours. BNP: BNP (last 3 results) No results for input(s): BNP in the last 8760 hours.  ProBNP (last 3 results) No results for input(s): PROBNP in the last 8760 hours.  CBG: No results for input(s): GLUCAP in the last 168 hours.     Signed:  Eddie North MD  [facility] 06/10/2015, 10:11 AM

## 2015-06-10 NOTE — Discharge Instructions (Signed)
Cellulitis °Cellulitis is an infection of the skin and the tissue beneath it. The infected area is usually red and tender. Cellulitis occurs most often in the arms and lower legs.  °CAUSES  °Cellulitis is caused by bacteria that enter the skin through cracks or cuts in the skin. The most common types of bacteria that cause cellulitis are staphylococci and streptococci. °SIGNS AND SYMPTOMS  °· Redness and warmth. °· Swelling. °· Tenderness or pain. °· Fever. °DIAGNOSIS  °Your health care provider can usually determine what is wrong based on a physical exam. Blood tests may also be done. °TREATMENT  °Treatment usually involves taking an antibiotic medicine. °HOME CARE INSTRUCTIONS  °· Take your antibiotic medicine as directed by your health care provider. Finish the antibiotic even if you start to feel better. °· Keep the infected arm or leg elevated to reduce swelling. °· Apply a warm cloth to the affected area up to 4 times per day to relieve pain. °· Take medicines only as directed by your health care provider. °· Keep all follow-up visits as directed by your health care provider. °SEEK MEDICAL CARE IF:  °· You notice red streaks coming from the infected area. °· Your red area gets larger or turns dark in color. °· Your bone or joint underneath the infected area becomes painful after the skin has healed. °· Your infection returns in the same area or another area. °· You notice a swollen bump in the infected area. °· You develop new symptoms. °· You have a fever. °SEEK IMMEDIATE MEDICAL CARE IF:  °· You feel very sleepy. °· You develop vomiting or diarrhea. °· You have a general ill feeling (malaise) with muscle aches and pains. °  °This information is not intended to replace advice given to you by your health care provider. Make sure you discuss any questions you have with your health care provider. °  °Document Released: 03/03/2005 Document Revised: 02/12/2015 Document Reviewed: 08/09/2011 °Elsevier Interactive  Patient Education ©2016 [employer] °Dental Caries °Dental caries (also called tooth decay) is the most common oral disease. It can occur at any age but is more common in children and young adults.  °HOW DENTAL CARIES DEVELOPS  °The process of decay begins when bacteria and foods (particularly sugars and starches) combine in your mouth to produce plaque. Plaque is a substance that sticks to the hard, outer surface of a tooth (enamel). The bacteria in plaque produce acids that attack enamel. These acids may also attack the root surface of a tooth (cementum) if it is exposed. Repeated attacks dissolve these surfaces and create holes in the tooth (cavities). If left untreated, the acids destroy the other layers of the tooth.  °RISK FACTORS °· Frequent sipping of sugary beverages.   °· Frequent snacking on sugary and starchy foods, especially those that easily get stuck in the teeth.   °· Poor oral hygiene.   °· Dry mouth.   °· Substance abuse such as methamphetamine abuse.   °· Broken or poor-fitting dental restorations.   °· Eating disorders.   °· Gastroesophageal reflux disease (GERD).   °· Certain radiation treatments to the head and neck. °SYMPTOMS °In the early stages of dental caries, symptoms are seldom present. Sometimes white, chalky areas may be seen on the enamel or other tooth layers. In later stages, symptoms may include: °· Pits and holes on the enamel. °· Toothache after sweet, hot, or cold foods or drinks are consumed. °· Pain around the tooth. °· Swelling around the tooth. °DIAGNOSIS  °Most of the time, dental   caries is detected during a regular dental checkup. A diagnosis is made after a thorough medical and dental history is taken and the surfaces of your teeth are checked for signs of dental caries. Sometimes special instruments, such as lasers, are used to check for dental caries. Dental X-ray exams may be taken so that areas not visible to the eye (such as between the contact areas of the teeth)  can be checked for cavities.  °TREATMENT  °If dental caries is in its early stages, it may be reversed with a fluoride treatment or an application of a remineralizing agent at the dental office. Thorough brushing and flossing at home is needed to aid these treatments. If it is in its later stages, treatment depends on the location and extent of tooth destruction:  °· If a small area of the tooth has been destroyed, the destroyed area will be removed and cavities will be filled with a material such as gold, silver amalgam, or composite resin.   °· If a large area of the tooth has been destroyed, the destroyed area will be removed and a cap (crown) will be fitted over the remaining tooth structure.   °· If the center part of the tooth (pulp) is affected, a procedure called a root canal will be needed before a filling or crown can be placed.   °· If most of the tooth has been destroyed, the tooth may need to be pulled (extracted). °HOME CARE INSTRUCTIONS °You can prevent, stop, or reverse dental caries at home by practicing good oral hygiene. Good oral hygiene includes: °· Thoroughly cleaning your teeth at least twice a day with a toothbrush and dental floss.   °· Using a fluoride toothpaste. A fluoride mouth rinse may also be used if recommended by your dentist or health care provider.   °· Restricting the amount of sugary and starchy foods and sugary liquids you consume.   °· Avoiding frequent snacking on these foods and sipping of these liquids.   °· Keeping regular visits with a dentist for checkups and cleanings. °PREVENTION  °· Practice good oral hygiene. °· Consider a dental sealant. A dental sealant is a coating material that is applied by your dentist to the pits and grooves of teeth. The sealant prevents food from being trapped in them. It may protect the teeth for several years. °· Ask about fluoride supplements if you live in a community without fluorinated water or with water that has a low fluoride  content. Use fluoride supplements as directed by your dentist or health care provider. °· Allow fluoride varnish applications to teeth if directed by your dentist or health care provider. °  °This information is not intended to replace advice given to you by your health care provider. Make sure you discuss any questions you have with your health care provider. °  °Document Released: 02/13/2002 Document Revised: 06/14/2014 Document Reviewed: 05/26/2012 °Elsevier Interactive Patient Education ©2016 [employer] ° °

## 2016-09-30 ENCOUNTER — Other Ambulatory Visit: Payer: Self-pay | Admitting: Pain Medicine

## 2016-09-30 DIAGNOSIS — M545 Low back pain: Secondary | ICD-10-CM

## 2016-11-05 IMAGING — CT CT MAXILLOFACIAL W/ CM
3 series · 14 of 47 positions shown, 16 images · IV contrast (omnipaque)
Comparison: None.

CLINICAL DATA: Severe right facial swelling and pain.

EXAM:
CT MAXILLOFACIAL WITH CONTRAST
TECHNIQUE: Multidetector CT imaging of the maxillofacial structures was
performed with intravenous contrast. Multiplanar CT image
reconstructions were also generated. A small metallic BB was placed
on the right temple in order to reliably differentiate right from
left.
CONTRAST:  75mL OMNIPAQUE IOHEXOL 300 MG/ML  SOLN

[Series 3: facial st · axial · 0.38mm/px · z∈[-224,-66]mm · 8 of 93 slices shown, 10 images]
[im 7/93  brain]
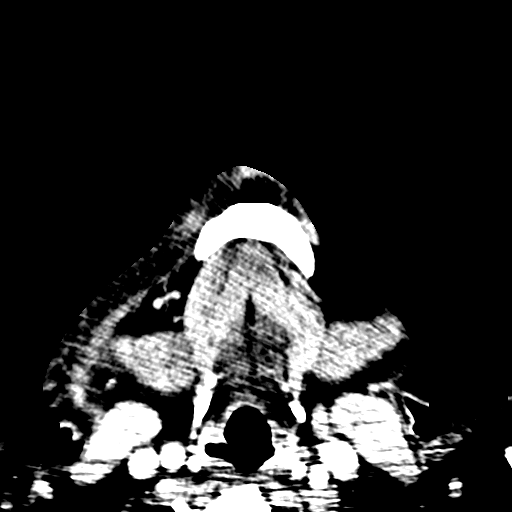
[im 7/93  bone]
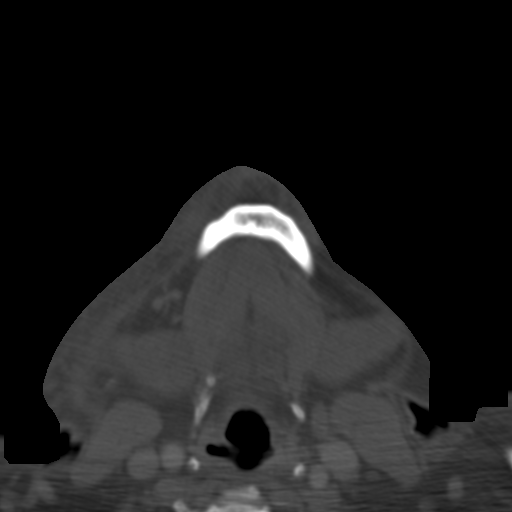
[im 20/93  bone]
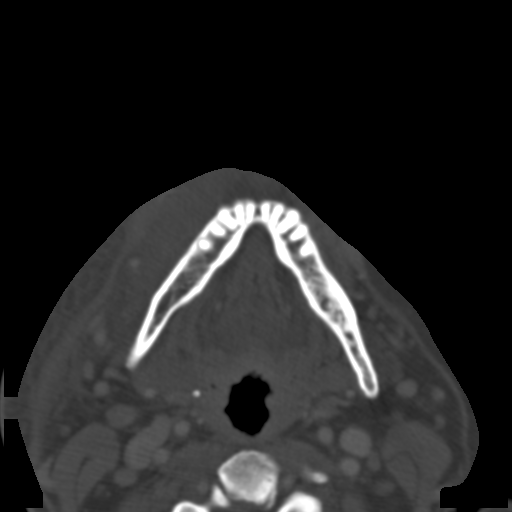
[im 29/93  bone]
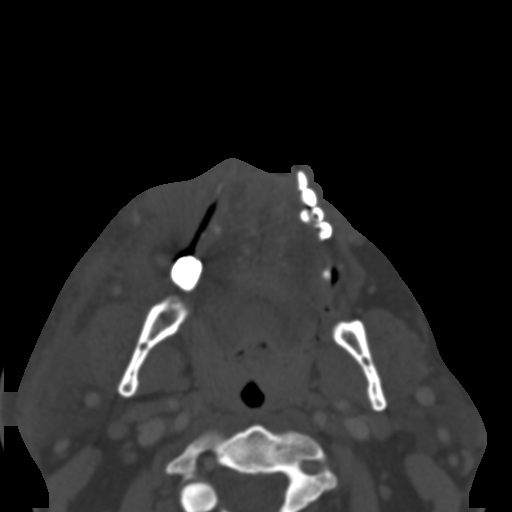
[im 42/93  bone]
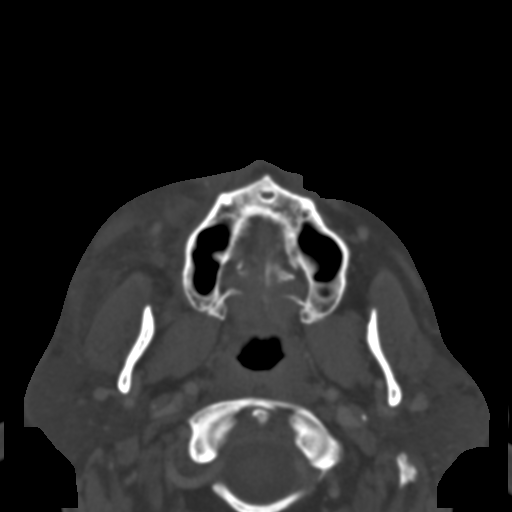
[im 51/93  brain]
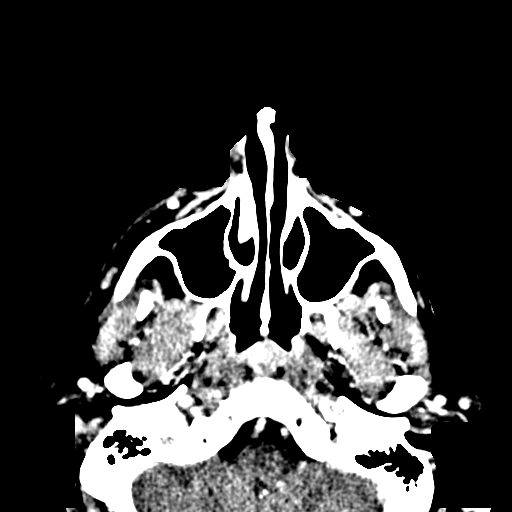
[im 51/93  bone]
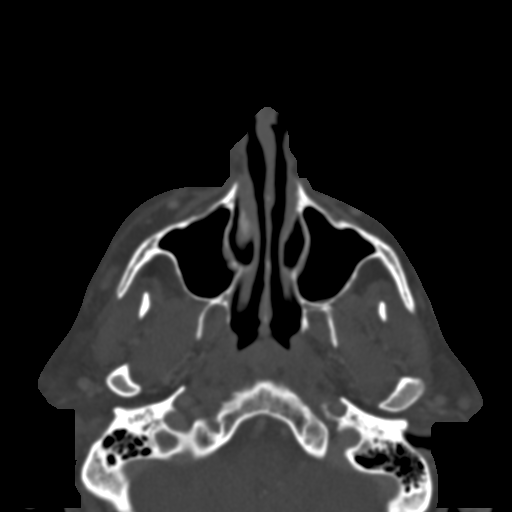
[im 64/93  bone]
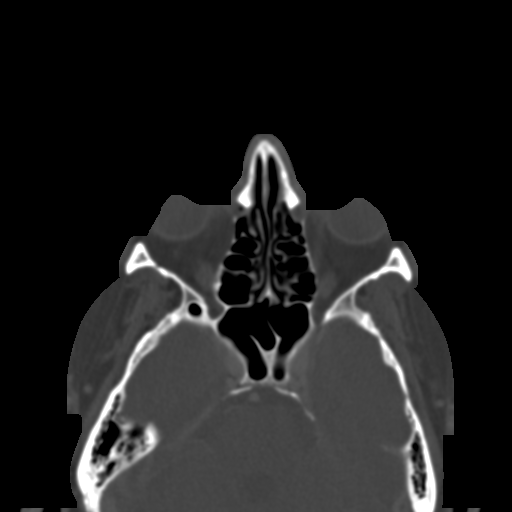
[im 73/93  bone]
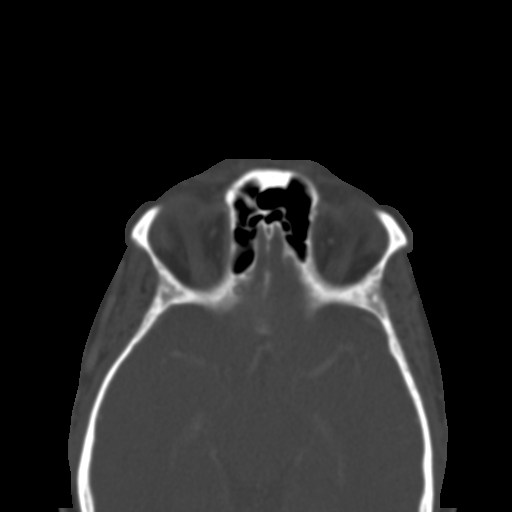
[im 86/93  bone]
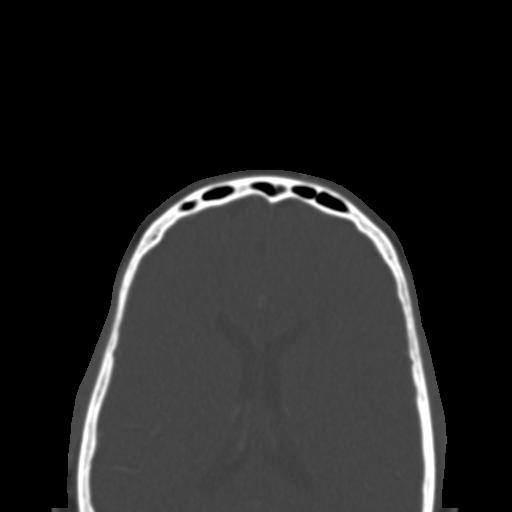

[Series 5: coronal st · coronal · 0.36mm/px · 3 of 104 slices shown]
[im 35/104  bone]
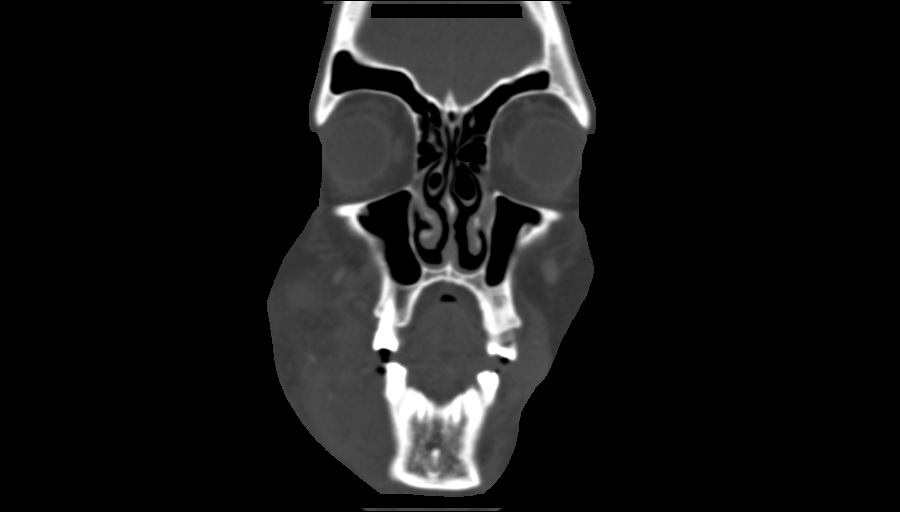
[im 46/104  bone]
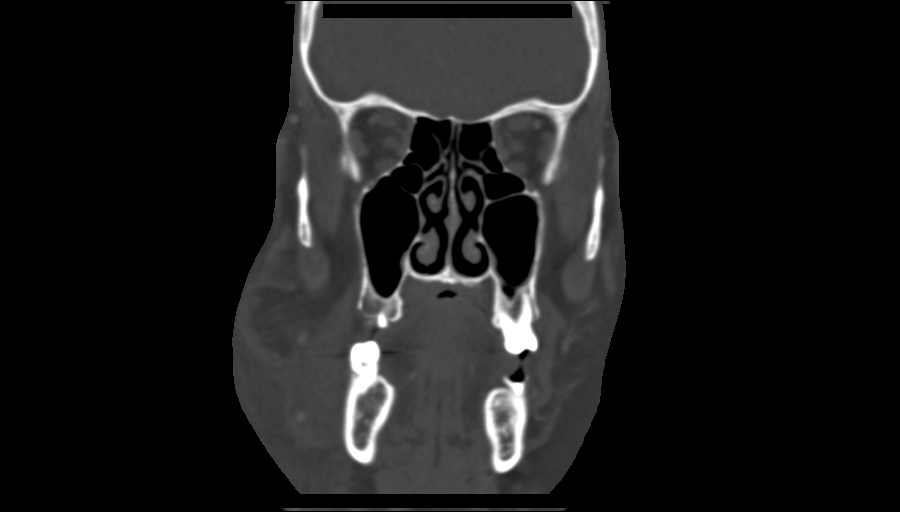
[im 58/104  bone]
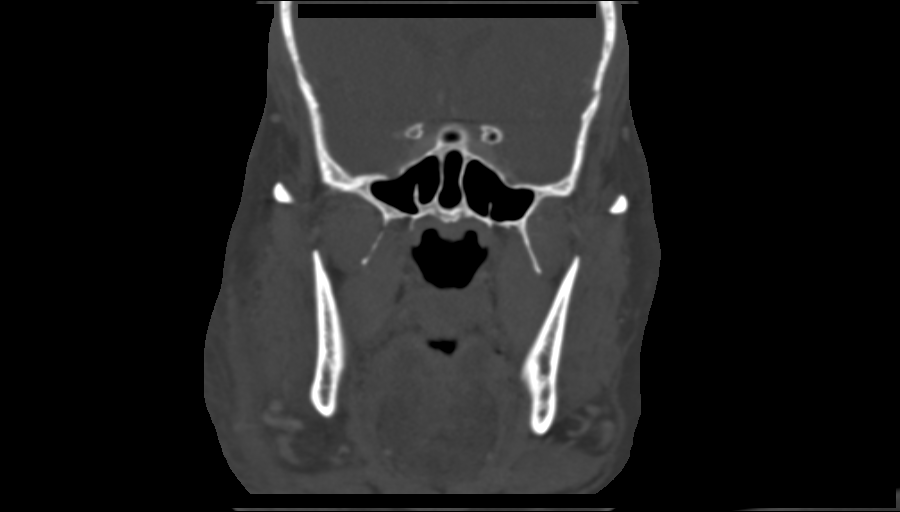

[Series 6: sagittal st · sagittal · 0.36mm/px · 3 of 83 slices shown]
[im 28/83  bone]
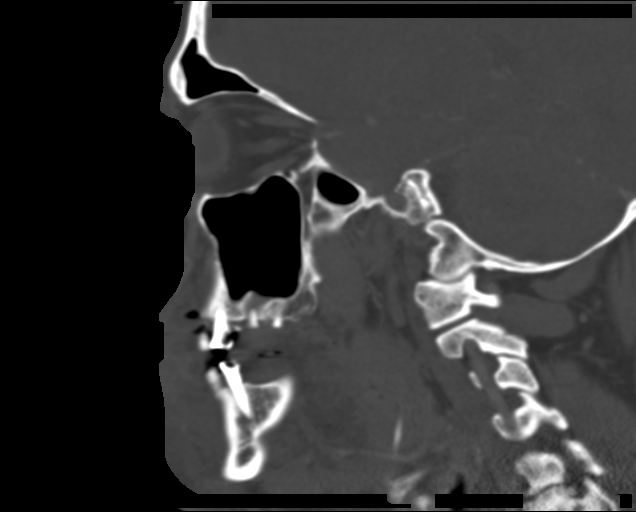
[im 42/83  bone]
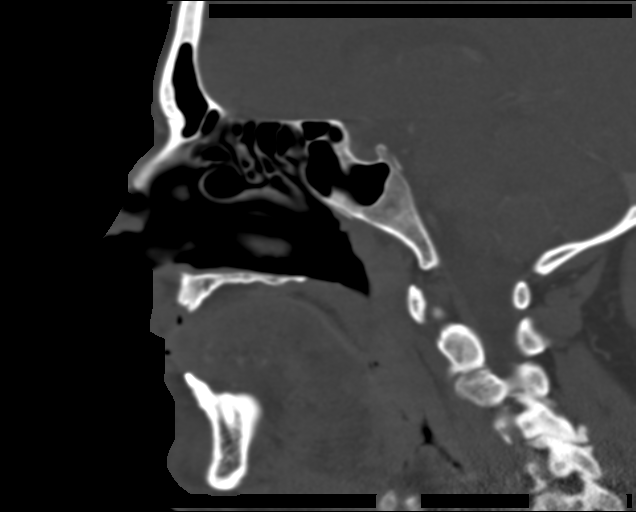
[im 55/83  bone]
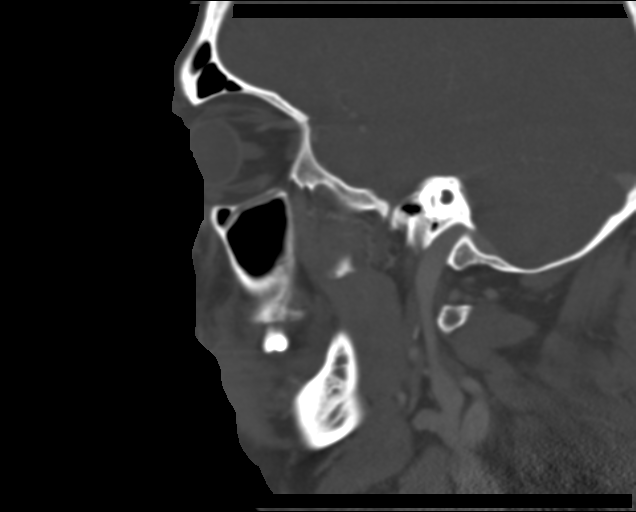

[14 of 47 positions shown; findings below may reference images not displayed]

FINDINGS: No fracture is noted. Paranasal sinuses appear normal. Pterygoid
plates appear normal. Poor dentition is noted. Bony lucency is seen
around the roots of posterior molars bilaterally which may represent
abscess. Globes and orbits are unremarkable. Abnormal soft tissue
stranding is noted adjacent to the right mandible without definite
abscess formation, most consistent with cellulitis. Parotid and
submandibular glands are unremarkable. Mildly enlarged lymph nodes
are noted in both level 2 regions, with the largest measuring 8 x 12
mm on the right ; these most likely are inflammatory in etiology.
IMPRESSION: Poor dentition is noted, with fluid-filled spaces around the roots
of posterior molars in the maxilla bilaterally concerning for
possible dental abscesses.

Extensive inflammatory changes are seen involving the soft tissues
lateral to the right side of the mandible without fluid collection
or abscess. This is most consistent with cellulitis.
